# Patient Record
Sex: Male | Born: 1995 | Race: White | Hispanic: No | Marital: Married | Smoking: Former smoker
Health system: Southern US, Community
[De-identification: ages and names within clinical notes are randomized; demographics above are authoritative.]

## PROBLEM LIST (undated history)

## (undated) DIAGNOSIS — J45909 Unspecified asthma, uncomplicated: Secondary | ICD-10-CM

## (undated) DIAGNOSIS — D492 Neoplasm of unspecified behavior of bone, soft tissue, and skin: Secondary | ICD-10-CM

---
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
## 2019-06-06 DIAGNOSIS — K219 Gastro-esophageal reflux disease without esophagitis: Secondary | ICD-10-CM | POA: Insufficient documentation

## 2021-05-28 ENCOUNTER — Encounter (HOSPITAL_BASED_OUTPATIENT_CLINIC_OR_DEPARTMENT_OTHER): Payer: Self-pay | Admitting: Emergency Medicine

## 2021-05-28 ENCOUNTER — Emergency Department (HOSPITAL_BASED_OUTPATIENT_CLINIC_OR_DEPARTMENT_OTHER)
Admission: EM | Admit: 2021-05-28 | Discharge: 2021-05-28 | Disposition: A | Discharge status: Home / Self Care | Payer: Self-pay | Attending: Emergency Medicine | Admitting: Emergency Medicine

## 2021-05-28 ENCOUNTER — Other Ambulatory Visit: Payer: Self-pay

## 2021-05-28 ENCOUNTER — Emergency Department (HOSPITAL_BASED_OUTPATIENT_CLINIC_OR_DEPARTMENT_OTHER): Payer: Self-pay

## 2021-05-28 DIAGNOSIS — Z85830 Personal history of malignant neoplasm of bone: Secondary | ICD-10-CM | POA: Insufficient documentation

## 2021-05-28 DIAGNOSIS — H5461 Unqualified visual loss, right eye, normal vision left eye: Secondary | ICD-10-CM

## 2021-05-28 DIAGNOSIS — Z87891 Personal history of nicotine dependence: Secondary | ICD-10-CM | POA: Insufficient documentation

## 2021-05-28 DIAGNOSIS — S0591XA Unspecified injury of right eye and orbit, initial encounter: Secondary | ICD-10-CM | POA: Insufficient documentation

## 2021-05-28 DIAGNOSIS — W228XXA Striking against or struck by other objects, initial encounter: Secondary | ICD-10-CM | POA: Insufficient documentation

## 2021-05-28 DIAGNOSIS — Y990 Civilian activity done for income or pay: Secondary | ICD-10-CM | POA: Insufficient documentation

## 2021-05-28 MED ORDER — FLUORESCEIN SODIUM 1 MG OP STRP
1.0000 | ORAL_STRIP | Freq: Once | OPHTHALMIC | Status: AC
  Administered 2021-05-28: 1 via OPHTHALMIC
  Filled 2021-05-28: qty 1

## 2021-05-28 MED ORDER — CYCLOPENTOLATE HCL 1 % OP SOLN
1.0000 [drp] | Freq: Once | OPHTHALMIC | Status: AC
  Administered 2021-05-28: 1 [drp] via OPHTHALMIC
  Filled 2021-05-28: qty 2

## 2021-05-28 MED ORDER — TETRACAINE HCL 0.5 % OP SOLN
2.0000 [drp] | Freq: Once | OPHTHALMIC | Status: AC
  Administered 2021-05-28: 2 [drp] via OPHTHALMIC
  Filled 2021-05-28: qty 4
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
## 2021-05-28 NOTE — Discharge Instructions (Addendum)
Please call Dr. Zenia Resides office first thing in the morning for recheck.

## 2021-08-26 ENCOUNTER — Emergency Department (HOSPITAL_BASED_OUTPATIENT_CLINIC_OR_DEPARTMENT_OTHER): Payer: Self-pay | Admitting: Radiology

## 2021-08-26 ENCOUNTER — Other Ambulatory Visit: Payer: Self-pay

## 2021-08-26 ENCOUNTER — Encounter (HOSPITAL_BASED_OUTPATIENT_CLINIC_OR_DEPARTMENT_OTHER): Payer: Self-pay

## 2021-08-26 ENCOUNTER — Emergency Department (HOSPITAL_BASED_OUTPATIENT_CLINIC_OR_DEPARTMENT_OTHER)
Admission: EM | Admit: 2021-08-26 | Discharge: 2021-08-26 | Disposition: A | Discharge status: Home / Self Care | Payer: Self-pay | Attending: Emergency Medicine | Admitting: Emergency Medicine

## 2021-08-26 DIAGNOSIS — R197 Diarrhea, unspecified: Secondary | ICD-10-CM | POA: Insufficient documentation

## 2021-08-26 DIAGNOSIS — R042 Hemoptysis: Secondary | ICD-10-CM | POA: Insufficient documentation

## 2021-08-26 DIAGNOSIS — Z87891 Personal history of nicotine dependence: Secondary | ICD-10-CM | POA: Insufficient documentation

## 2021-08-26 LAB — COMPREHENSIVE METABOLIC PANEL
ALT: 20 U/L (ref 0–44)
AST: 20 U/L (ref 15–41)
Albumin: 4.8 g/dL (ref 3.5–5.0)
Alkaline Phosphatase: 80 U/L (ref 38–126)
Anion gap: 8 (ref 5–15)
BUN: 11 mg/dL (ref 6–20)
CO2: 29 mmol/L (ref 22–32)
Calcium: 10.1 mg/dL (ref 8.9–10.3)
Chloride: 106 mmol/L (ref 98–111)
Creatinine, Ser: 1.26 mg/dL — ABNORMAL HIGH (ref 0.61–1.24)
GFR, Estimated: >60 mL/min (ref >60)
Glucose, Bld: 97 mg/dL (ref 70–99)
Potassium: 3.9 mmol/L (ref 3.5–5.1)
Sodium: 143 mmol/L (ref 135–145)
Total Bilirubin: 0.4 mg/dL (ref 0.3–1.2)
Total Protein: 8.1 g/dL (ref 6.5–8.1)

## 2021-08-26 LAB — CBC WITH DIFFERENTIAL/PLATELET
Abs Immature Granulocytes: 0.01 10*3/uL (ref 0.00–0.07)
Basophils Absolute: 0 10*3/uL (ref 0.0–0.1)
Basophils Relative: 0 %
Eosinophils Absolute: 0.1 10*3/uL (ref 0.0–0.5)
Eosinophils Relative: 1 %
HCT: 45.2 % (ref 39.0–52.0)
Hemoglobin: 15.2 g/dL (ref 13.0–17.0)
Immature Granulocytes: 0 %
Lymphocytes Relative: 20 %
Lymphs Abs: 1.1 10*3/uL (ref 0.7–4.0)
MCH: 29.7 pg (ref 26.0–34.0)
MCHC: 33.6 g/dL (ref 30.0–36.0)
MCV: 88.3 fL (ref 80.0–100.0)
Monocytes Absolute: 0.5 10*3/uL (ref 0.1–1.0)
Monocytes Relative: 9 %
Neutro Abs: 3.9 10*3/uL (ref 1.7–7.7)
Neutrophils Relative %: 70 %
Platelets: 299 10*3/uL (ref 150–400)
RBC: 5.12 MIL/uL (ref 4.22–5.81)
RDW: 12.4 % (ref 11.5–15.5)
WBC: 5.6 10*3/uL (ref 4.0–10.5)
nRBC: 0 % (ref 0.0–0.2)

## 2021-08-26 NOTE — ED Triage Notes (Signed)
He tells me that he had, and completely recovered from mild u.r.I. ~ 2 weeks ago. He is here today with c/o upper abd. Discomfort; 3 diarrhea stools today and upon him coughing at work this morning, his boss noticed that he had coughed up "some blood". He is ambulatory and in no distress.

## 2021-08-26 NOTE — ED Provider Notes (Signed)
[facility] Provider Note  CSN: [id number] Arrival date & time: 08/26/21 6378    History Chief Complaint  Patient presents with   Hemoptysis    Colin Morales is a 25 y.o. male with history of 'stomach problems' (thinks he has IBS but no formal diagnosis) reports he has had diarrhea for the last two days, more than usual for him, but no abdominal pain, fever or blood. No vomiting. He was at work today when he coughed up some bloody sputum x 1 and his boss advised him to come to the ED. He had a URI about 2 weeks ago that he had recovered from. No CP or SOB.    Past Medical History:  Diagnosis Date   Bone tumor     No past surgical history on file.  No family history on file.  Social History   Tobacco Use   Smoking status: Former    Types: Cigarettes    Quit date: 05/28/2020    Years since quitting: 1.2    Passive exposure: Yes   Smokeless tobacco: Former    Quit date: 05/28/2020  Vaping Use   Vaping Use: Never used     Home Medications Prior to Admission medications   Medication Sig Start Date End Date Taking? Authorizing Provider  escitalopram (LEXAPRO) 5 MG tablet Take 5 mg by mouth daily.    [provider]  ibuprofen (ADVIL,MOTRIN) 200 MG tablet Take 600 mg by mouth once.    [provider]  lactobacillus acidophilus & bulgar (LACTINEX) chewable tablet Chew 1 tablet by mouth 3 (three) times daily with meals. For 5 days 05/11/15   Harlene Salts, MD  Naproxen Sodium (ALEVE) 220 MG CAPS Take 220 mg by mouth daily.    [provider]     Allergies    Patient has no known allergies.   Review of Systems   Review of Systems A comprehensive review of systems was completed and negative except as noted in HPI.    Physical Exam BP 134/72 (BP Location: Right Arm)   Pulse (!) 59   Temp 98.1 F (36.7 C) (Oral)   Resp 16   SpO2 98%   Physical Exam Vitals and nursing note reviewed.  Constitutional:       Appearance: Normal appearance.  HENT:     Head: Normocephalic and atraumatic.     Nose: Nose normal.     Mouth/Throat:     Mouth: Mucous membranes are moist.  Eyes:     Extraocular Movements: Extraocular movements intact.     Conjunctiva/sclera: Conjunctivae normal.  Cardiovascular:     Rate and Rhythm: Normal rate.  Pulmonary:     Effort: Pulmonary effort is normal.     Breath sounds: Normal breath sounds.  Abdominal:     General: Abdomen is flat.     Palpations: Abdomen is soft.     Tenderness: There is no abdominal tenderness.  Musculoskeletal:        General: No swelling. Normal range of motion.     Cervical back: Neck supple.  Skin:    General: Skin is warm and dry.  Neurological:     General: No focal deficit present.     Mental Status: He is alert.  Psychiatric:        Mood and Affect: Mood normal.     ED Results / Procedures / Treatments   Labs (all labs ordered are listed, but only abnormal results are displayed) Labs Reviewed - No  data to display  EKG None   Radiology No results found.  Procedures Procedures  Medications Ordered in the ED Medications - No data to display   MDM Rules/Calculators/A&P MDM Patient well appearing with one episode of low volume hemoptysis, recent had URI but had recovered. Also having multiple episodes of diarrhea, 10+ in the last 2 days, will check labs and CXR.   ED Course  I have reviewed the triage vital signs and the nursing notes.  Pertinent labs & imaging results that were available during my care of the patient were reviewed by me and considered in my medical decision making (see chart for details).  Clinical Course as of 08/26/21 1143  Wed Aug 26, 2021  1034 CXR is clear.  [CS]  7948 CBC is normal.  [CS]  0165 CMP is about at baseline.  [CS]  1140 No further coughing or episodes of hemoptysis while in the ED. Could be due to recent URI symptoms. But appears well, no hypoxia or other signs of distress.  Recommend GI follow up for his 'stomach issues' and diarrhea if they continue. RTED for any other concerns.  [CS]    Clinical Course User Index [CS] Truddie Hidden, MD    Final Clinical Impression(s) / ED Diagnoses Final diagnoses:  Hemoptysis  Diarrhea, unspecified type    Rx / DC Orders ED Discharge Orders     None        Truddie Hidden, MD 08/26/21 1143

## 2022-02-18 ENCOUNTER — Emergency Department (HOSPITAL_BASED_OUTPATIENT_CLINIC_OR_DEPARTMENT_OTHER)
Admission: EM | Admit: 2022-02-18 | Discharge: 2022-02-19 | Disposition: A | Discharge status: Home / Self Care | Payer: 59 | Attending: Emergency Medicine | Admitting: Emergency Medicine

## 2022-02-18 ENCOUNTER — Emergency Department (HOSPITAL_BASED_OUTPATIENT_CLINIC_OR_DEPARTMENT_OTHER): Payer: 59

## 2022-02-18 ENCOUNTER — Encounter (HOSPITAL_BASED_OUTPATIENT_CLINIC_OR_DEPARTMENT_OTHER): Payer: Self-pay

## 2022-02-18 ENCOUNTER — Other Ambulatory Visit: Payer: Self-pay

## 2022-02-18 DIAGNOSIS — R1032 Left lower quadrant pain: Secondary | ICD-10-CM | POA: Insufficient documentation

## 2022-02-18 DIAGNOSIS — R1031 Right lower quadrant pain: Secondary | ICD-10-CM | POA: Diagnosis not present

## 2022-02-18 DIAGNOSIS — R1084 Generalized abdominal pain: Secondary | ICD-10-CM

## 2022-02-18 HISTORY — DX: Unspecified asthma, uncomplicated: J45.909

## 2022-02-18 LAB — URINALYSIS, ROUTINE W REFLEX MICROSCOPIC
Bilirubin Urine: NEGATIVE
Glucose, UA: NEGATIVE mg/dL
Hgb urine dipstick: NEGATIVE
Ketones, ur: NEGATIVE mg/dL
Leukocytes,Ua: NEGATIVE
Nitrite: NEGATIVE
Protein, ur: NEGATIVE mg/dL
Specific Gravity, Urine: 1.006 (ref 1.005–1.030)
pH: 7.5 (ref 5.0–8.0)

## 2022-02-18 LAB — COMPREHENSIVE METABOLIC PANEL
ALT: 18 U/L (ref 0–44)
AST: 19 U/L (ref 15–41)
Albumin: 4.9 g/dL (ref 3.5–5.0)
Alkaline Phosphatase: 78 U/L (ref 38–126)
Anion gap: 8 (ref 5–15)
BUN: 10 mg/dL (ref 6–20)
CO2: 28 mmol/L (ref 22–32)
Calcium: 9.9 mg/dL (ref 8.9–10.3)
Chloride: 106 mmol/L (ref 98–111)
Creatinine, Ser: 1.13 mg/dL (ref 0.61–1.24)
GFR, Estimated: >60 mL/min (ref >60)
Glucose, Bld: 75 mg/dL (ref 70–99)
Potassium: 3.9 mmol/L (ref 3.5–5.1)
Sodium: 142 mmol/L (ref 135–145)
Total Bilirubin: 0.4 mg/dL (ref 0.3–1.2)
Total Protein: 7.8 g/dL (ref 6.5–8.1)

## 2022-02-18 LAB — LIPASE, BLOOD: Lipase: 34 U/L (ref 11–51)

## 2022-02-18 LAB — CBC
HCT: 43.1 % (ref 39.0–52.0)
Hemoglobin: 14.3 g/dL (ref 13.0–17.0)
MCH: 29.1 pg (ref 26.0–34.0)
MCHC: 33.2 g/dL (ref 30.0–36.0)
MCV: 87.6 fL (ref 80.0–100.0)
Platelets: 265 10*3/uL (ref 150–400)
RBC: 4.92 MIL/uL (ref 4.22–5.81)
RDW: 12.5 % (ref 11.5–15.5)
WBC: 7.3 10*3/uL (ref 4.0–10.5)
nRBC: 0 % (ref 0.0–0.2)

## 2022-02-18 MED ORDER — IOHEXOL 300 MG/ML  SOLN
100.0000 mL | Freq: Once | INTRAMUSCULAR | Status: AC | PRN
  Administered 2022-02-18: 85 mL via INTRAVENOUS

## 2022-02-18 MED ORDER — ONDANSETRON HCL 4 MG/2ML IJ SOLN
4.0000 mg | Freq: Once | INTRAMUSCULAR | Status: AC
  Administered 2022-02-18: 4 mg via INTRAVENOUS
  Filled 2022-02-18: qty 2

## 2022-02-18 MED ORDER — SODIUM CHLORIDE 0.9 % IV BOLUS
1000.0000 mL | Freq: Once | INTRAVENOUS | Status: AC
  Administered 2022-02-18: 1000 mL via INTRAVENOUS

## 2022-02-18 MED ORDER — KETOROLAC TROMETHAMINE 30 MG/ML IJ SOLN
30.0000 mg | Freq: Once | INTRAMUSCULAR | Status: AC
  Administered 2022-02-18: 30 mg via INTRAVENOUS
  Filled 2022-02-18: qty 1

## 2022-02-18 NOTE — ED Notes (Signed)
Patient has cup for urine sample ?

## 2022-02-18 NOTE — ED Provider Notes (Signed)
?[facility] ?Provider Note ? ? ?CSN: [id number] ?Arrival date & time: 02/18/22  1847 ? ?  ? ?History ? ?Chief Complaint  ?Patient presents with  ? Abdominal Pain  ? ? ?Colin Morales is a 26 y.o. male. ? ?Patient is a 26 year old male with past medical history of abdominal pain for which she was diagnosed with an underactive gallbladder 5 or 6 years ago.  Patient presenting with worsening abdominal pain over the past 3 weeks.  He describes constant pain to the lower abdomen that waxes and wanes.  He reports having normal bowel movements and denies any diarrhea, nausea, or vomiting.  He denies any bloody stools.  He reports intermittent fevers, but denies ill contacts or having consumed any undercooked or suspicious foods. ? ?The history is provided by the patient.  ?Abdominal Pain ?Pain location:  Generalized ?Pain quality: cramping   ?Pain radiates to:  Does not radiate ?Pain severity:  Moderate ?Onset quality:  Gradual ?Duration:  3 weeks ?Timing:  Constant ?Progression:  Worsening ?Chronicity:  New ?Relieved by:  Nothing ?Worsened by:  Movement and palpation ? ?  ? ?Home Medications ?Prior to Admission medications   ?Medication Sig Start Date End Date Taking? Authorizing Provider  ?ibuprofen (ADVIL,MOTRIN) 200 MG tablet Take 600 mg by mouth once.    [provider]  ?lactobacillus acidophilus & bulgar (LACTINEX) chewable tablet Chew 1 tablet by mouth 3 (three) times daily with meals. For 5 days ?Patient not taking: No sig reported 05/11/15   Harlene Salts, MD  ?   ? ?Allergies    ?Patient has no known allergies.   ? ?Review of Systems   ?Review of Systems  ?Gastrointestinal:  Positive for abdominal pain.  ?All other systems reviewed and are negative. ? ?Physical Exam ?Updated Vital Signs ?BP 123/67   Pulse (!) 54   Temp 98.3 ?F (36.8 ?C)   Resp 17   Ht '6\' 1"'$  (1.854 m)   Wt 93 kg   SpO2 100%   BMI 27.05 kg/m?  ?Physical Exam ?Vitals and nursing note reviewed.   ?Constitutional:   ?   General: He is not in acute distress. ?   Appearance: He is well-developed. He is not diaphoretic.  ?HENT:  ?   Head: Normocephalic and atraumatic.  ?Cardiovascular:  ?   Rate and Rhythm: Normal rate and regular rhythm.  ?   Heart sounds: No murmur heard. ?  No friction rub.  ?Pulmonary:  ?   Effort: Pulmonary effort is normal. No respiratory distress.  ?   Breath sounds: Normal breath sounds. No wheezing or rales.  ?Abdominal:  ?   General: Bowel sounds are normal. There is no distension.  ?   Palpations: Abdomen is soft.  ?   Tenderness: There is abdominal tenderness in the right lower quadrant, suprapubic area and left lower quadrant. There is no right CVA tenderness, left CVA tenderness, guarding or rebound.  ?Musculoskeletal:     ?   General: Normal range of motion.  ?   Cervical back: Normal range of motion and neck supple.  ?Skin: ?   General: Skin is warm and dry.  ?Neurological:  ?   Mental Status: He is alert and oriented to person, place, and time.  ?   Coordination: Coordination normal.  ? ? ?ED Results / Procedures / Treatments   ?Labs ?(all labs ordered are listed, but only abnormal results are displayed) ?Labs Reviewed  ?URINALYSIS, ROUTINE W REFLEX MICROSCOPIC - Abnormal; Notable for  the following components:  ?    Result Value  ? Color, Urine COLORLESS (*)   ? All other components within normal limits  ?LIPASE, BLOOD  ?COMPREHENSIVE METABOLIC PANEL  ?CBC  ? ? ?EKG ?None ? ?Radiology ?No results found. ? ?Procedures ?Procedures  ? ? ?Medications Ordered in ED ?Medications  ?sodium chloride 0.9 % bolus 1,000 mL (has no administration in time range)  ?ondansetron (ZOFRAN) injection 4 mg (has no administration in time range)  ?ketorolac (TORADOL) 30 MG/ML injection 30 mg (has no administration in time range)  ? ? ?ED Course/ Medical Decision Making/ A&P ? ?Patient is a 26 year old male presenting with complaints of abdominal pain as described in the HPI.  He has a history of GI  issues, but has worsened over the past 3 weeks.  Patient's laboratory studies are reassuring and CT scan shows no acute abnormality.  He does have an increased stool burden consistent with slow transit.  I will recommend magnesium citrate and place a referral for the patient to follow-up as an outpatient with gastroenterology.  But seems appropriate for discharge this evening.  There is nothing surgical on his CAT scan. ? ?Final Clinical Impression(s) / ED Diagnoses ?Final diagnoses:  ?None  ? ? ?Rx / DC Orders ?ED Discharge Orders   ? ? None  ? ?  ? ? ?  ?Veryl Speak, MD ?02/19/22 0005 ? ?

## 2022-02-18 NOTE — ED Triage Notes (Signed)
CC of abdominal pain in right and left lower quadrant x3 weeks. Pt states it is intermittent. Pt states has n/v/d and is lightheaded. Denies fever.  ?

## 2022-02-19 NOTE — Discharge Instructions (Signed)
Take magnesium citrate.  Drink the entire 10 ounce bottle for relief of constipation.  This medication is available over-the-counter without a prescription. ? ?Follow-up with gastroenterology in the next week.  The contact information for Dr. Paulita Fujita with [facility] has been provided in this discharge summary for you to call and make these arrangements. ? ?Return to the emergency department if you develop high fever, worsening pain, bloody stool, or other new and concerning symptoms. ?

## 2023-05-08 ENCOUNTER — Encounter (HOSPITAL_BASED_OUTPATIENT_CLINIC_OR_DEPARTMENT_OTHER): Payer: Self-pay

## 2023-05-08 ENCOUNTER — Emergency Department (HOSPITAL_BASED_OUTPATIENT_CLINIC_OR_DEPARTMENT_OTHER): Payer: Self-pay

## 2023-05-08 ENCOUNTER — Inpatient Hospital Stay (HOSPITAL_BASED_OUTPATIENT_CLINIC_OR_DEPARTMENT_OTHER)
Admission: EM | Admit: 2023-05-08 | Discharge: 2023-05-10 | DRG: 603 | Disposition: A | Discharge status: Home / Self Care | Payer: Self-pay | Attending: Family Medicine | Admitting: Family Medicine

## 2023-05-08 ENCOUNTER — Other Ambulatory Visit: Payer: Self-pay

## 2023-05-08 DIAGNOSIS — L03116 Cellulitis of left lower limb: Principal | ICD-10-CM | POA: Diagnosis present

## 2023-05-08 DIAGNOSIS — R7302 Impaired glucose tolerance (oral): Secondary | ICD-10-CM | POA: Diagnosis present

## 2023-05-08 DIAGNOSIS — J45909 Unspecified asthma, uncomplicated: Secondary | ICD-10-CM | POA: Diagnosis present

## 2023-05-08 DIAGNOSIS — Z87891 Personal history of nicotine dependence: Secondary | ICD-10-CM

## 2023-05-08 LAB — COMPREHENSIVE METABOLIC PANEL
ALT: 13 U/L (ref 0–44)
AST: 14 U/L — ABNORMAL LOW (ref 15–41)
Albumin: 4.3 g/dL (ref 3.5–5.0)
Alkaline Phosphatase: 69 U/L (ref 38–126)
Anion gap: 7 (ref 5–15)
BUN: 13 mg/dL (ref 6–20)
CO2: 27 mmol/L (ref 22–32)
Calcium: 9.3 mg/dL (ref 8.9–10.3)
Chloride: 106 mmol/L (ref 98–111)
Creatinine, Ser: 0.97 mg/dL (ref 0.61–1.24)
GFR, Estimated: >60 mL/min (ref >60)
Glucose, Bld: 111 mg/dL — ABNORMAL HIGH (ref 70–99)
Potassium: 3.8 mmol/L (ref 3.5–5.1)
Sodium: 140 mmol/L (ref 135–145)
Total Bilirubin: 0.3 mg/dL (ref 0.3–1.2)
Total Protein: 7 g/dL (ref 6.5–8.1)

## 2023-05-08 LAB — CBC WITH DIFFERENTIAL/PLATELET
Abs Immature Granulocytes: 0.03 10*3/uL (ref 0.00–0.07)
Basophils Absolute: 0 10*3/uL (ref 0.0–0.1)
Basophils Relative: 0 %
Eosinophils Absolute: 0.2 10*3/uL (ref 0.0–0.5)
Eosinophils Relative: 1 %
HCT: 38.9 % — ABNORMAL LOW (ref 39.0–52.0)
Hemoglobin: 13.4 g/dL (ref 13.0–17.0)
Immature Granulocytes: 0 %
Lymphocytes Relative: 16 %
Lymphs Abs: 1.8 10*3/uL (ref 0.7–4.0)
MCH: 30.4 pg (ref 26.0–34.0)
MCHC: 34.4 g/dL (ref 30.0–36.0)
MCV: 88.2 fL (ref 80.0–100.0)
Monocytes Absolute: 0.8 10*3/uL (ref 0.1–1.0)
Monocytes Relative: 7 %
Neutro Abs: 8.7 10*3/uL — ABNORMAL HIGH (ref 1.7–7.7)
Neutrophils Relative %: 76 %
Platelets: 257 10*3/uL (ref 150–400)
RBC: 4.41 MIL/uL (ref 4.22–5.81)
RDW: 12.6 % (ref 11.5–15.5)
WBC: 11.5 10*3/uL — ABNORMAL HIGH (ref 4.0–10.5)
nRBC: 0 % (ref 0.0–0.2)

## 2023-05-08 MED ORDER — VANCOMYCIN HCL IN DEXTROSE 1-5 GM/200ML-% IV SOLN
1000.0000 mg | Freq: Once | INTRAVENOUS | Status: AC
  Administered 2023-05-09: 1000 mg via INTRAVENOUS
  Filled 2023-05-08: qty 200

## 2023-05-08 MED ORDER — VANCOMYCIN HCL IN DEXTROSE 1-5 GM/200ML-% IV SOLN
1000.0000 mg | Freq: Once | INTRAVENOUS | Status: AC
  Administered 2023-05-08: 1000 mg via INTRAVENOUS
  Filled 2023-05-08: qty 200

## 2023-05-08 MED ORDER — IOHEXOL 300 MG/ML  SOLN
100.0000 mL | Freq: Once | INTRAMUSCULAR | Status: AC | PRN
  Administered 2023-05-08: 100 mL via INTRAVENOUS

## 2023-05-08 MED ORDER — VANCOMYCIN HCL IN DEXTROSE 1-5 GM/200ML-% IV SOLN: 1000.0000 mg | Freq: Once | INTRAVENOUS | Status: DC

## 2023-05-08 MED ORDER — PIPERACILLIN-TAZOBACTAM 3.375 G IVPB 30 MIN
3.3750 g | Freq: Once | INTRAVENOUS | Status: AC
  Administered 2023-05-09: 3.375 g via INTRAVENOUS
  Filled 2023-05-08: qty 50

## 2023-05-08 NOTE — ED Provider Notes (Signed)
EMERGENCY DEPARTMENT AT [facility] Provider Note   CSN: [id number] Arrival date & time: 05/08/23  1931     History  Chief Complaint  Patient presents with   Abscess    Camarie Regen is a 27 y.o. male.  The history is provided by the patient, medical records and a significant other. No language interpreter was used.  Rash Location:  Leg Leg rash location:  L upper leg Quality: draining, painful and redness   Pain details:    Quality:  Aching   Severity:  Severe   Onset quality:  Gradual   Duration:  1 week   Timing:  Constant   Progression:  Worsening Severity:  Severe Timing:  Constant Progression:  Spreading Context: not insect bite/sting   Relieved by:  Nothing Ineffective treatments: antibiotics. Associated symptoms: fever and induration   Associated symptoms: no abdominal pain, no diarrhea, no fatigue, no headaches, no nausea, no shortness of breath, no URI, not vomiting and not wheezing        Home Medications Prior to Admission medications   Medication Sig Start Date End Date Taking? Authorizing Provider  ibuprofen (ADVIL,MOTRIN) 200 MG tablet Take 600 mg by mouth once.    [provider]  lactobacillus acidophilus & bulgar (LACTINEX) chewable tablet Chew 1 tablet by mouth 3 (three) times daily with meals. For 5 days Patient not taking: No sig reported 05/11/15   Ree Shay, MD      Allergies    Patient has no known allergies.    Review of Systems   Review of Systems  Constitutional:  Positive for chills and fever. Negative for diaphoresis and fatigue.  HENT:  Negative for congestion.   Respiratory:  Negative for cough, chest tightness, shortness of breath and wheezing.   Cardiovascular:  Negative for chest pain and palpitations.  Gastrointestinal:  Negative for abdominal pain, constipation, diarrhea, nausea and vomiting.  Genitourinary:  Negative for dysuria and flank pain.  Musculoskeletal:  Negative for back pain, neck  pain and neck stiffness.  Skin:  Positive for rash. Negative for wound.  Neurological:  Negative for light-headedness and headaches.  Psychiatric/Behavioral:  Negative for agitation and confusion.   All other systems reviewed and are negative.   Physical Exam Updated Vital Signs BP 139/82 (BP Location: Right Arm)   Pulse 74   Temp 98.7 F (37.1 C) (Oral)   Resp 20   Ht 6\' 1"  (1.854 m)   Wt 97.5 kg   SpO2 100%   BMI 28.37 kg/m  Physical Exam Vitals and nursing note reviewed.  Constitutional:      General: He is not in acute distress.    Appearance: He is well-developed. He is not ill-appearing, toxic-appearing or diaphoretic.  HENT:     Head: Normocephalic and atraumatic.     Mouth/Throat:     Mouth: Mucous membranes are moist.  Eyes:     Extraocular Movements: Extraocular movements intact.     Conjunctiva/sclera: Conjunctivae normal.     Pupils: Pupils are equal, round, and reactive to light.  Cardiovascular:     Rate and Rhythm: Normal rate and regular rhythm.     Heart sounds: No murmur heard. Pulmonary:     Effort: Pulmonary effort is normal. No respiratory distress.     Breath sounds: Normal breath sounds. No wheezing, rhonchi or rales.  Chest:     Chest wall: No tenderness.  Abdominal:     General: Abdomen is flat.     Palpations: Abdomen  is soft.     Tenderness: There is no abdominal tenderness. There is no guarding or rebound.  Musculoskeletal:        General: Tenderness present.     Cervical back: Neck supple.     Left upper leg: Tenderness present.     Right lower leg: No edema.     Left lower leg: No edema.     Comments: Patient has a large wound to left upper inner thigh with surrounding erythema, tenderness, but no crepitance.  There is some black area that could be necrosis and some purulent drainage although there is a large wound present.  Surrounding induration, warmth, and tenderness but do not appreciate large fluctuance.  No other tenderness in the  inguinal area or abdomen.  Intact sensation, strength, and pulses distally.    Skin:    General: Skin is warm and dry.     Capillary Refill: Capillary refill takes less than 2 seconds.     Findings: Erythema present.  Neurological:     Mental Status: He is alert.     Sensory: No sensory deficit.     Motor: No weakness.  Psychiatric:        Mood and Affect: Mood normal.      ED Results / Procedures / Treatments   Labs (all labs ordered are listed, but only abnormal results are displayed) Labs Reviewed  CBC WITH DIFFERENTIAL/PLATELET - Abnormal; Notable for the following components:      Result Value   WBC 11.5 (*)    HCT 38.9 (*)    Neutro Abs 8.7 (*)    All other components within normal limits  COMPREHENSIVE METABOLIC PANEL - Abnormal; Notable for the following components:   Glucose, Bld 111 (*)    AST 14 (*)    All other components within normal limits  CULTURE, BLOOD (ROUTINE X 2)  CULTURE, BLOOD (ROUTINE X 2)  LACTIC ACID, PLASMA  LACTIC ACID, PLASMA    EKG None  Radiology No results found.  Procedures Procedures    Medications Ordered in ED Medications  vancomycin (VANCOCIN) IVPB 1000 mg/200 mL premix (1,000 mg Intravenous New Bag/Given 05/08/23 2313)    And  vancomycin (VANCOCIN) IVPB 1000 mg/200 mL premix (has no administration in time range)    ED Course/ Medical Decision Making/ A&P                             Medical Decision Making Amount and/or Complexity of Data Reviewed Labs: ordered. Radiology: ordered.  Risk Prescription drug management.    Hristopher Hopman is a 27 y.o. male with a past medical history significant for asthma who presents with leg infection.  According to patient, for the last week or so he started having some discomfort in his left inner thigh and groin area.  He thought it was just chafing but it started having more pain and redness.  Patient went to eye doctor the other day and was started on Keflex which has been on  for the last 2 days.  He reports that over the last 48 hours it has rapidly worsened and has had 3 days of fevers at home.  He reports it got swollen like a baseball and then this morning it popped and has a large crater with purulence and tenderness.  Patient reports despite taking antibiotics the redness is spreading both proximally and laterally and the pain is severe.  He is having fevers and  chills still but is denying nausea or vomiting.  He denies any numbness, tingling, or weakness of the leg but it is hurting when he moves it.  No urinary symptoms or GI symptoms otherwise.  Patient is concerned that it is spreading more rapidly and is not improving despite antibiotics.  On exam, patient has an impressive wound to his left inner thigh.  See clinical photo.  There is surrounding tenderness and induration but I do not appreciate fluctuance initially or crepitance.  There is tenderness that is away from the wound itself.  Intact sensation, strength, and pulses distally.  Lungs clear and chest and abdomen nontender.  Clinically I am concerned patient is failing outpatient antibiotics for cellulitis.  Due to the appearance and how deep this wound is looking, will get CT scan to rule out deep infection.  Will get screening labs.  Will go ahead and start IV antibiotics with vancomycin and anticipate he will need admission for IV antibiotics for failing outpatient antibiotics of cellulitis.  Will give pain medicine.  Anticipate reassessment after imaging and labs.  Care transferred to oncoming team to wait for reassessment after imaging.             Final Clinical Impression(s) / ED Diagnoses Final diagnoses:  Cellulitis of left lower extremity     Clinical Impression: 1. Cellulitis of left lower extremity     Disposition: Care transferred to oncoming team to wait for results of labs and imaging and reassessment of leg infection.  Anticipate admission for further management of failing  outpatient antibiotics of cellulitis with wound and drainage.  This note was prepared with assistance of Conservation officer, historic buildings. Occasional wrong-word or sound-a-like substitutions may have occurred due to the inherent limitations of voice recognition software.     Delena Casebeer, Canary Brim, MD 05/08/23 [phone]

## 2023-05-08 NOTE — ED Triage Notes (Signed)
Patient here POV from Home.  Endorses Abscess to Left Upper Medial Thigh. Present since Thursday. Placed on Antibiotics Saturday. Ruptured most likely last pm. Seeks Evaluation for size of abscess and drainage. Drainage is Blood and Pus. 101 Fever Friday PM. None confirmed since.   NAD Noted during Triage. A&Ox4. GCS 15. Ambulatory.

## 2023-05-09 DIAGNOSIS — L03116 Cellulitis of left lower limb: Secondary | ICD-10-CM

## 2023-05-09 LAB — CBC WITH DIFFERENTIAL/PLATELET
Abs Immature Granulocytes: 0.02 10*3/uL (ref 0.00–0.07)
Basophils Absolute: 0 10*3/uL (ref 0.0–0.1)
Basophils Relative: 0 %
Eosinophils Absolute: 0.1 10*3/uL (ref 0.0–0.5)
Eosinophils Relative: 1 %
HCT: 40.7 % (ref 39.0–52.0)
Hemoglobin: 13.2 g/dL (ref 13.0–17.0)
Immature Granulocytes: 0 %
Lymphocytes Relative: 13 %
Lymphs Abs: 1.4 10*3/uL (ref 0.7–4.0)
MCH: 29.2 pg (ref 26.0–34.0)
MCHC: 32.4 g/dL (ref 30.0–36.0)
MCV: 90 fL (ref 80.0–100.0)
Monocytes Absolute: 0.5 10*3/uL (ref 0.1–1.0)
Monocytes Relative: 5 %
Neutro Abs: 8.5 10*3/uL — ABNORMAL HIGH (ref 1.7–7.7)
Neutrophils Relative %: 81 %
Platelets: 261 10*3/uL (ref 150–400)
RBC: 4.52 MIL/uL (ref 4.22–5.81)
RDW: 12.6 % (ref 11.5–15.5)
WBC: 10.5 10*3/uL (ref 4.0–10.5)
nRBC: 0 % (ref 0.0–0.2)

## 2023-05-09 LAB — BASIC METABOLIC PANEL
Anion gap: 9 (ref 5–15)
BUN: 10 mg/dL (ref 6–20)
CO2: 23 mmol/L (ref 22–32)
Calcium: 8.8 mg/dL — ABNORMAL LOW (ref 8.9–10.3)
Chloride: 108 mmol/L (ref 98–111)
Creatinine, Ser: 0.92 mg/dL (ref 0.61–1.24)
GFR, Estimated: >60 mL/min (ref >60)
Glucose, Bld: 97 mg/dL (ref 70–99)
Potassium: 3.6 mmol/L (ref 3.5–5.1)
Sodium: 140 mmol/L (ref 135–145)

## 2023-05-09 LAB — MRSA NEXT GEN BY PCR, NASAL: MRSA by PCR Next Gen: NOT DETECTED

## 2023-05-09 LAB — AEROBIC CULTURE W GRAM STAIN (SUPERFICIAL SPECIMEN)

## 2023-05-09 LAB — LACTIC ACID, PLASMA: Lactic Acid, Venous: 0.7 mmol/L (ref 0.5–1.9)

## 2023-05-09 LAB — HIV ANTIBODY (ROUTINE TESTING W REFLEX): HIV Screen 4th Generation wRfx: NONREACTIVE

## 2023-05-09 MED ORDER — DIPHENHYDRAMINE HCL 25 MG PO CAPS: 25.0000 mg | ORAL_CAPSULE | Freq: Four times a day (QID) | ORAL | Status: DC | PRN

## 2023-05-09 MED ORDER — VANCOMYCIN HCL 1250 MG/250ML IV SOLN: 1250.0000 mg | Freq: Three times a day (TID) | INTRAVENOUS | Status: DC

## 2023-05-09 MED ORDER — ONDANSETRON HCL 4 MG/2ML IJ SOLN: 4.0000 mg | Freq: Four times a day (QID) | INTRAMUSCULAR | Status: DC | PRN

## 2023-05-09 MED ORDER — ENOXAPARIN SODIUM 40 MG/0.4ML IJ SOSY
40.0000 mg | PREFILLED_SYRINGE | INTRAMUSCULAR | Status: DC
  Administered 2023-05-09 – 2023-05-10 (×2): 40 mg via SUBCUTANEOUS
  Filled 2023-05-09 (×2): qty 0.4

## 2023-05-09 MED ORDER — DIPHENHYDRAMINE HCL 25 MG PO CAPS: 25.0000 mg | ORAL_CAPSULE | Freq: Three times a day (TID) | ORAL | Status: DC

## 2023-05-09 MED ORDER — VANCOMYCIN HCL 1250 MG/250ML IV SOLN
1250.0000 mg | Freq: Three times a day (TID) | INTRAVENOUS | Status: DC
  Filled 2023-05-09: qty 250

## 2023-05-09 MED ORDER — ACETAMINOPHEN 325 MG PO TABS
650.0000 mg | ORAL_TABLET | Freq: Four times a day (QID) | ORAL | Status: DC | PRN
  Administered 2023-05-09: 650 mg via ORAL
  Filled 2023-05-09: qty 2

## 2023-05-09 MED ORDER — VANCOMYCIN HCL 2000 MG/400ML IV SOLN: 2000.0000 mg | Freq: Two times a day (BID) | INTRAVENOUS | Status: DC

## 2023-05-09 MED ORDER — DIPHENHYDRAMINE HCL 50 MG/ML IJ SOLN
25.0000 mg | Freq: Once | INTRAMUSCULAR | Status: AC
  Administered 2023-05-09: 25 mg via INTRAVENOUS
  Filled 2023-05-09: qty 1

## 2023-05-09 MED ORDER — CEFAZOLIN SODIUM-DEXTROSE 2-4 GM/100ML-% IV SOLN
2.0000 g | Freq: Three times a day (TID) | INTRAVENOUS | Status: DC
  Administered 2023-05-09: 2 g via INTRAVENOUS
  Filled 2023-05-09: qty 100

## 2023-05-09 MED ORDER — VANCOMYCIN HCL 1250 MG/250ML IV SOLN
1250.0000 mg | Freq: Three times a day (TID) | INTRAVENOUS | Status: DC
  Administered 2023-05-09: 1250 mg via INTRAVENOUS
  Filled 2023-05-09: qty 250

## 2023-05-09 MED ORDER — ONDANSETRON HCL 4 MG PO TABS: 4.0000 mg | ORAL_TABLET | Freq: Four times a day (QID) | ORAL | Status: DC | PRN

## 2023-05-09 MED ORDER — ACETAMINOPHEN 650 MG RE SUPP: 650.0000 mg | Freq: Four times a day (QID) | RECTAL | Status: DC | PRN

## 2023-05-09 MED ORDER — LINEZOLID 600 MG PO TABS
600.0000 mg | ORAL_TABLET | Freq: Two times a day (BID) | ORAL | Status: DC
  Administered 2023-05-09 – 2023-05-10 (×3): 600 mg via ORAL
  Filled 2023-05-09 (×3): qty 1

## 2023-05-09 MED ORDER — DIPHENHYDRAMINE HCL 50 MG/ML IJ SOLN
25.0000 mg | Freq: Four times a day (QID) | INTRAMUSCULAR | Status: DC | PRN
  Administered 2023-05-09: 25 mg via INTRAVENOUS
  Filled 2023-05-09: qty 1

## 2023-05-09 MED ORDER — SENNOSIDES-DOCUSATE SODIUM 8.6-50 MG PO TABS: 1.0000 | ORAL_TABLET | Freq: Every evening | ORAL | Status: DC | PRN

## 2023-05-09 NOTE — Progress Notes (Addendum)
Interim progress note not for billing  See h and p from earlier this morning. Agree w/ assessment and plan except as otherwise stated.  Admitted for draining abscess/cellulitis medial left upper thigh. No signs sepsis. Failed outpatient abx with keflex. Here started on ancef. Blood cultures obtained, results pending. I will cancel ancef and broaden to vancomycin as we need to cover for mrsa, will obtain a culture as well as no swab has yet been obtained. Nothing that currently appears to need surgical drainage. Possible d/c tomorrow if improving. HIV pending.  Addendum: red man reaction with vancomycin, after discussion with pharmacy will switch abx to zyvox

## 2023-05-09 NOTE — Progress Notes (Addendum)
Pharmacy Antibiotic Note  Colin Morales is a 27 y.o. male admitted on 05/08/2023 with large wound to left upper inner thigh with surrounding erythema, tenderness, but no crepitance. .  Pharmacy has been consulted for vancomycin dosing.  Pt received loading dose @ [facility]  ( 1 gm x 2 doses @ 2313 & 0032)  Had Redman syndrome w/ Vanc 1 gm @ [facility] -rate slowed & IV benadryl ordered Plan: Vancomycin 1250 mg IV q8h (AUC 443.2, Scr 0.92) Infuse each dose over 2 hours d/t Redman syndrome Add prn benadryl for now. Will schedule as premed if indicated Follow renal function and clinical course  Height: 6\' 1"  (185.4 cm) Weight: 97.5 kg (215 lb) IBW/kg (Calculated) : 79.9  Temp (24hrs), Avg:98.6 F (37 C), Min:97.9 F (36.6 C), Max:99 F (37.2 C)  Recent Labs  Lab 05/08/23 2227 05/08/23 2251 05/09/23 0505  WBC 11.5*  --  10.5  CREATININE 0.97  --  0.92  LATICACIDVEN  --  0.7  --     Estimated Creatinine Clearance: 152.2 mL/min (by C-G formula based on SCr of 0.92 mg/dL).    No Known Allergies  Antimicrobials this admission: 7/15 vanc >> 7/15 ancef x 1 7/15 zosyn x 1 Dose adjustments this admission:  Microbiology results: 7/15 BCx:  sent 7/15 MRSA PCR: neg  Thank you for allowing pharmacy to be a part of this patient's care.  Herby Abraham, Pharm.D Use secure chat for questions 05/09/2023 8:49 AM  Addendum: RN started  Vanc slow as directed by pharmacy at 0955. Patient was ok until about 2 minutes ago. His forehead and behind his ears are turning red and flushed. Patient denies any tongue swelling or itching.> I ordered benadryl 25 IV x 1 Plan: decrease infusion rate to 2.5 hours & scheduled benadryl 25 mg po 30 minutes before each dose  Herby Abraham, Pharm.D Use secure chat for questions 05/09/2023 11:23 AM

## 2023-05-09 NOTE — Consult Note (Addendum)
WOC Nurse Consult Note: patient states he had a boil Left upper inner thigh that ruptured; patient currently receiving IV antibiotics for cellulitis to area  Reason for Consult: L inner thigh wound  Wound type: infectious full thickness  Pressure Injury POA: NA  Measurement: 4 cm x 2 cm x 0.5 cm  Wound bed: 75% dark red/black (likely mixture of clotted blood and necrotic tissue) 25% yellow necrotic  Drainage (amount, consistency, odor) moderate purulent  Periwound: indurated, erythematous  Dressing procedure/placement/frequency: Clean with Vashe [place] [phone]) then apply Vashe moistened (not saturated) gauze to area twice daily.  Cover with dry gauze and secure with silicone foam. May lift foam to reapply Vashe. Change foam dressing q3 days and prn soiling.   POC discussed with patient and bedside nurse.  WOC team will not follow at this time.  Please re-consult if further needs arise.   Thank you,     Priscella Mann MSN, RN-BC, [employer] [phone]

## 2023-05-09 NOTE — ED Notes (Addendum)
Pt called out with concerns for an allergic reaction went into room Pt appeared to be experiencing Redman Syndrome. I discontinued Vancomycin drip.  Provider was advised and came to bed side. I was advised by Dr Nicanor Alcon to cut rate by half and give a dose of Benadryl. Vancomycin rate was changed to 100. Benadryl was ordered. [facility] had Pt loaded for transport and advised they would give Pt Benadryl on the way to [facility].

## 2023-05-09 NOTE — Progress Notes (Signed)
Patient is transferred from [facility] to 5 E at 0200. Alert and oriented x 4. LE cellulitis and abscess. No pain complained at this time. Vital signs was taken and call light was within patient's reach. Paged admission on call as well and waiting for new orders.

## 2023-05-09 NOTE — Plan of Care (Signed)

## 2023-05-09 NOTE — Progress Notes (Signed)
Pharmacy Antibiotic Note  Colin Morales is a 27 y.o. male admitted on 05/08/2023 with large wound to left upper inner thigh with surrounding erythema, tenderness, but no crepitance. .  Pharmacy has been consulted for vancomycin dosing.  Pt received loading dose in ED  Plan: Vancomycin 2gm IV q12h (AUC 497.4, Scr 0.97) Follow renal function and clinical course  Height: 6\' 1"  (185.4 cm) Weight: 97.5 kg (215 lb) IBW/kg (Calculated) : 79.9  Temp (24hrs), Avg:98.5 F (36.9 C), Min:97.9 F (36.6 C), Max:99 F (37.2 C)  Recent Labs  Lab 05/08/23 2227 05/08/23 2251  WBC 11.5*  --   CREATININE 0.97  --   LATICACIDVEN  --  0.7    Estimated Creatinine Clearance: 144.3 mL/min (by C-G formula based on SCr of 0.97 mg/dL).    No Known Allergies  Antimicrobials this admission: 7/15 vanc >> 7/15 ancef >>  Dose adjustments this admission:   Microbiology results: 7/15 BCx:   7/15 MRSA PCR:   Thank you for allowing pharmacy to be a part of this patient's care. Arley Phenix RPh 05/09/2023, 4:31 AM

## 2023-05-09 NOTE — Progress Notes (Signed)
Hospitalist Transfer Note:  Transferring facility: [facility] Requesting provider: Dr. Nicanor Alcon (EDP at [facility]) Reason for transfer: admission for further evaluation and management of LLE cellulitis in setting of failure of outpatient oral abx.    27  year old male who presented to [facility] ED complaining of progressive erythema, discomfort, swelling, increased warmth associated with the proximal aspect of the left lower extremity at the level of the thigh, admits that the symptoms have progressed over the course of the last week in spite of good compliance with outpatient oral antibiotics over that timeframe, although the specific nature of the oral antibiotics is unclear to me.  There may have been previously an abscess at the site, although there is noted to to be active drainage from the proximal aspect of the left lower extremity, with CT of this area demonstrating subcutaneous swelling consistent with cellulitis, in the absence of any drainable fluid collection and in the absence of any subcutaneous gas to suggest necrotizing fasciitis.  Vital signs appear stable, including afebrile, normotensive, without tachycardia, while noting respiratory rate 18-20, and oxygen saturation 98 to 100% on room air.  He has a mildly elevated white blood cell count of 11,500.  Blood cultures x 2 were collected in the ED prior to initiation of IV vancomycin and Zosyn.  Subsequently, I accepted this patient for transfer for inpatient admission to a med-tele bed at [facility] or [facility] (first available) for further work-up and management of the above.      Nursing staff, Please call [facility] Admits & Consults System-Wide number on Amion [phone]) as soon as patient's arrival, so appropriate admitting provider can evaluate the pt.     Newton Pigg, DO Hospitalist

## 2023-05-09 NOTE — H&P (Signed)
PCP:   [facility]   Chief Complaint:  Lower extremity cellulitis  HPI: This is a 27 year old male with no significant past medical history.  On Wednesday he noted a boil in his right groin.  On Thursday it was noticeably bigger.  By Friday it was a size of a small baseball.  Friday night and Saturday night he spiked fevers Tmax 101.  On Saturday he went to urgent care where he was prescribed antibiotics.  The infection started to drain.  Yesterday in the morning it busted and formed a crater.  As the day progressed crater started getting deeper, developing into a jagged hole.  It was red and hot.  Per patient he's had no previous skin infection.  He went to [facility].  At [facility] labs were unremarkable except for WBC of 11.5. CT of this area demonstrating subcutaneous swelling consistent with cellulitis, in the absence of any drainable fluid collection and in the absence of any subcutaneous gas to suggest necrotizing fasciitis.  But given the speed of progression of the infection and its suboptimal response antibiotics admission requested  Review of Systems:  Per HPI  Past Medical History: Past Medical History:  Diagnosis Date   Asthma    Bone tumor    History reviewed. No pertinent surgical history.  Medications: Prior to Admission medications   Medication Sig Start Date End Date Taking? Authorizing Provider  ibuprofen (ADVIL,MOTRIN) 200 MG tablet Take 600 mg by mouth once.    [provider]  lactobacillus acidophilus & bulgar (LACTINEX) chewable tablet Chew 1 tablet by mouth 3 (three) times daily with meals. For 5 days Patient not taking: No sig reported 05/11/15   Ree Shay, MD    Allergies:  No Known Allergies  Social History:  reports that he quit smoking about 2 years ago. His smoking use included cigarettes. He has been exposed to tobacco smoke. He quit smokeless tobacco use about 2 years ago. He reports current alcohol use.  He reports that he does not currently use drugs.  Family History: History reviewed. No pertinent family history.  Physical Exam: Vitals:   05/08/23 1936 05/08/23 2237 05/09/23 0035 05/09/23 0203  BP: 139/82 119/78  134/89  Pulse: 74 70  71  Resp: 20 18  20   Temp: 98.7 F (37.1 C)  99 F (37.2 C) 97.9 F (36.6 C)  TempSrc: Oral  Oral Oral  SpO2: 100% 98%  100%  Weight:      Height:        General:  Alert and oriented times three, well developed and nourished, no acute distress Eyes: PERRLA, pink conjunctiva, no scleral icterus ENT: Moist oral mucosa, neck supple, no thyromegaly Lungs: clear to ascultation, no wheeze, no crackles, no use of accessory muscles Cardiovascular: regular rate and rhythm, no regurgitation, no gallops, no murmurs. No carotid bruits, no JVD Abdomen: soft, positive BS, non-tender, non-distended, no organomegaly, not an acute abdomen GU: not examined Neuro: CN II - XII grossly intact, sensation intact Musculoskeletal: strength 5/5 all extremities, no clubbing, cyanosis or edema Skin: Upper inner left thigh, large wound, necrotic edges. Erythematous and firm.  Early tunneling Psych: appropriate patient  Labs on Admission:  Recent Labs    05/08/23 2227  NA 140  K 3.8  CL 106  CO2 27  GLUCOSE 111*  BUN 13  CREATININE 0.97  CALCIUM 9.3   Recent Labs    05/08/23 2227  AST 14*  ALT 13  ALKPHOS 69  BILITOT 0.3  PROT 7.0  ALBUMIN 4.3    Recent Labs    05/08/23 2227  WBC 11.5*  NEUTROABS 8.7*  HGB 13.4  HCT 38.9*  MCV 88.2  PLT 257    Radiological Exams on Admission: CT PELVIS W CONTRAST  Result Date: 05/08/2023 CLINICAL DATA:  Soft tissue infection suspected EXAM: CT PELVIS WITH CONTRAST TECHNIQUE: Multidetector CT imaging of the pelvis was performed using the standard protocol following the bolus administration of intravenous contrast. RADIATION DOSE REDUCTION: This exam was performed according to the departmental dose-optimization  program which includes automated exposure control, adjustment of the mA and/or kV according to patient size and/or use of iterative reconstruction technique. CONTRAST:  OMNIPAQUE IOHEXOL 300 MG/ML  SOLN COMPARISON:  CT abdomen and pelvis 02/18/2022 FINDINGS: Urinary Tract:  No abnormality visualized. Bowel:  Unremarkable visualized pelvic bowel loops. Vascular/Lymphatic: No pathologically enlarged lymph nodes. No significant vascular abnormality seen. Reproductive:  No mass or other significant abnormality Other: 1.9 x 0.8 cm ulceration along the medial superior left thigh (2/75). Associated skin thickening and subcutaneous fat stranding. No subcutaneous gas. No abscess. Musculoskeletal: No suspicious bone lesions identified. IMPRESSION: Small ulceration along the medial superior left thigh with associated skin thickening and subcutaneous fat stranding. No subcutaneous gas. No abscess. Electronically Signed   By: Minerva Fester M.D.   On: 05/08/2023 23:58    Assessment/Plan Present on Admission:  Cellulitis of left lower extremity -MRSA screen ordered.  Negative -Continue Ancef per cellulitis protocol -Wound care consult placed -Blood cultures collected   Naveen Clardy 05/09/2023, 2:16 AM

## 2023-05-10 ENCOUNTER — Encounter: Payer: Self-pay | Admitting: Family Medicine

## 2023-05-10 ENCOUNTER — Other Ambulatory Visit (HOSPITAL_COMMUNITY): Payer: Self-pay

## 2023-05-10 LAB — RAPID URINE DRUG SCREEN, HOSP PERFORMED
Amphetamines: NOT DETECTED
Barbiturates: NOT DETECTED
Benzodiazepines: NOT DETECTED
Cocaine: NOT DETECTED
Opiates: NOT DETECTED
Tetrahydrocannabinol: NOT DETECTED

## 2023-05-10 LAB — CBC WITH DIFFERENTIAL/PLATELET
Abs Immature Granulocytes: 0.02 10*3/uL (ref 0.00–0.07)
Basophils Absolute: 0 10*3/uL (ref 0.0–0.1)
Basophils Relative: 0 %
Eosinophils Absolute: 0.1 10*3/uL (ref 0.0–0.5)
Eosinophils Relative: 1 %
HCT: 41.5 % (ref 39.0–52.0)
Hemoglobin: 13.3 g/dL (ref 13.0–17.0)
Immature Granulocytes: 0 %
Lymphocytes Relative: 15 %
Lymphs Abs: 1.4 10*3/uL (ref 0.7–4.0)
MCH: 28.8 pg (ref 26.0–34.0)
MCHC: 32 g/dL (ref 30.0–36.0)
MCV: 89.8 fL (ref 80.0–100.0)
Monocytes Absolute: 0.5 10*3/uL (ref 0.1–1.0)
Monocytes Relative: 5 %
Neutro Abs: 7.2 10*3/uL (ref 1.7–7.7)
Neutrophils Relative %: 79 %
Platelets: 321 10*3/uL (ref 150–400)
RBC: 4.62 MIL/uL (ref 4.22–5.81)
RDW: 12.5 % (ref 11.5–15.5)
WBC: 9.2 10*3/uL (ref 4.0–10.5)
nRBC: 0 % (ref 0.0–0.2)

## 2023-05-10 LAB — COMPREHENSIVE METABOLIC PANEL
ALT: 16 U/L (ref 0–44)
AST: 14 U/L — ABNORMAL LOW (ref 15–41)
Albumin: 3.7 g/dL (ref 3.5–5.0)
Alkaline Phosphatase: 63 U/L (ref 38–126)
Anion gap: 9 (ref 5–15)
BUN: 10 mg/dL (ref 6–20)
CO2: 24 mmol/L (ref 22–32)
Calcium: 9.2 mg/dL (ref 8.9–10.3)
Chloride: 109 mmol/L (ref 98–111)
Creatinine, Ser: 1.01 mg/dL (ref 0.61–1.24)
GFR, Estimated: >60 mL/min (ref >60)
Glucose, Bld: 108 mg/dL — ABNORMAL HIGH (ref 70–99)
Potassium: 3.7 mmol/L (ref 3.5–5.1)
Sodium: 142 mmol/L (ref 135–145)
Total Bilirubin: 0.8 mg/dL (ref 0.3–1.2)
Total Protein: 7.2 g/dL (ref 6.5–8.1)

## 2023-05-10 MED ORDER — VASHE WOUND 0.033 % EX SOLN
1.0000 | CUTANEOUS | 0 refills | Status: DC
  Filled 2023-05-10: qty 1000, 21d supply, fill #0

## 2023-05-10 MED ORDER — LINEZOLID 600 MG PO TABS
600.0000 mg | ORAL_TABLET | Freq: Two times a day (BID) | ORAL | 0 refills | Status: AC
Start: 2023-05-10 — End: ?
  Filled 2023-05-10: qty 8, 4d supply, fill #0

## 2023-05-10 MED ORDER — SODIUM CHLORIDE 3 % IN NEBU
INHALATION_SOLUTION | RESPIRATORY_TRACT | 12 refills | Status: AC | PRN
  Filled 2023-05-10: qty 750, 30d supply, fill #0

## 2023-05-10 NOTE — Hospital Course (Signed)
Clean with Vashe [place] [phone]) then apply Vashe moistened (not saturated) gauze to area twice daily. Cover with dry gauze and secure with silicone foam. May lift foam to reapply Vashe. Change foam dressing q3 days and prn soiling.

## 2023-05-10 NOTE — Care Management (Signed)
   MATCH Medication Assistance Card  Name: Colin Morales ID (MRN): [id number]  Bin: [id number]  RX Group: [id number]  Discharge Date: 05/10/2023 Expiration Date: 05/17/2023 (must be filled within 7 days of discharge)  Dear [name]:  You have been approved to have the prescriptions written by your discharging physician filled through our [facility] (Medication Assistance Through [facility]) program. This program allows for a one-time (no refills) 34-day supply of selected medications for a low copay amount.  The copay is $3.00 per prescription. For instance, if you have one prescription, you will pay $3.00; for two prescriptions, you pay $6.00; for three prescriptions, you pay $9.00; and so on.  Only certain pharmacies are participating in this program with [facility]. You will need to select one of the pharmacies from the attached list and take your prescriptions, this letter, and your photo ID to one of the participating pharmacies.   We are excited that you are able to use the [facility] program to get your medications. These prescriptions must be filled within 7 days of hospital discharge or they will no longer be valid for the [facility] program. Should you have any problems with your prescriptions please contact your case management team member at [phone] for [facility].  Thank you, [facility] Health Care Management [facility] Transitions of Care Dept [phone]

## 2023-05-10 NOTE — TOC Transition Note (Signed)
Transition of Care [facility]) - CM/SW Discharge Note   Patient Details  Name: Colin Morales MRN: [id number] Date of Birth: 1996-06-29  Transition of Care [facility]) CM/SW Contact:  Otelia Santee, LCSW Phone Number: 05/10/2023, 11:58 AM   Clinical Narrative:    Pt has no insurance and shares that he typically pays for his medications out of pocket however, no longer has a job and is unable to afford medications. Pt is agreeable to using [facility] program and is aware that this program can only be used 1x within a 12 month period.  MATCH letter provided to pt. Pt's mother is to take to [facility] outpatient pharmacy to pick up medications.    Final next level of care: Home/Self Care Barriers to Discharge: No Barriers Identified   Patient Goals and CMS Choice CMS Medicare.gov Compare Post Acute Care list provided to:: Patient Choice offered to / list presented to : Patient  Discharge Placement                         Discharge Plan and Services Additional resources added to the After Visit Summary for                  DME Arranged: N/A DME Agency: NA                  Social Determinants of Health (SDOH) Interventions SDOH Screenings   Food Insecurity: No Food Insecurity (05/09/2023)  Housing: Low Risk  (05/09/2023)  Transportation Needs: No Transportation Needs (05/09/2023)  Utilities: Not At Risk (05/09/2023)  Tobacco Use: Medium Risk (05/08/2023)     Readmission Risk Interventions    05/10/2023   11:57 AM  Readmission Risk Prevention Plan  Post Dischage Appt Complete  Medication Screening Complete  Transportation Screening Complete

## 2023-05-10 NOTE — Plan of Care (Signed)
  Problem: Education: Goal: Knowledge of General Education information will improve Description: Including pain rating scale, medication(s)/side effects and non-pharmacologic comfort measures Outcome: Progressing   Problem: Health Behavior/Discharge Planning: Goal: Ability to manage health-related needs will improve Outcome: Progressing   Problem: Clinical Measurements: Goal: Ability to maintain clinical measurements within normal limits will improve Outcome: Progressing Goal: Diagnostic test results will improve Outcome: Progressing   Problem: Safety: Goal: Ability to remain free from injury will improve Outcome: Progressing   Problem: Clinical Measurements: Goal: Respiratory complications will improve Outcome: Completed/Met Goal: Cardiovascular complication will be avoided Outcome: Completed/Met   Problem: Activity: Goal: Risk for activity intolerance will decrease Outcome: Completed/Met   Problem: Nutrition: Goal: Adequate nutrition will be maintained Outcome: Completed/Met   Problem: Coping: Goal: Level of anxiety will decrease Outcome: Completed/Met   Problem: Elimination: Goal: Will not experience complications related to bowel motility Outcome: Completed/Met Goal: Will not experience complications related to urinary retention Outcome: Completed/Met   Problem: Pain Managment: Goal: General experience of comfort will improve Outcome: Completed/Met   Problem: Skin Integrity: Goal: Risk for impaired skin integrity will decrease Outcome: Completed/Met   Problem: Clinical Measurements: Goal: Will remain free from infection Outcome: Not Progressing   Problem: Education: Goal: Knowledge of General Education information will improve Description: Including pain rating scale, medication(s)/side effects and non-pharmacologic comfort measures Outcome: Progressing   Problem: Health Behavior/Discharge Planning: Goal: Ability to manage health-related needs will  improve Outcome: Progressing   Problem: Clinical Measurements: Goal: Ability to maintain clinical measurements within normal limits will improve Outcome: Progressing Goal: Diagnostic test results will improve Outcome: Progressing   Problem: Safety: Goal: Ability to remain free from injury will improve Outcome: Progressing

## 2023-05-10 NOTE — Discharge Summary (Addendum)
Physician Discharge Summary  Colin Morales URK:[id number] DOB: 20-Sep-1996 DOA: 05/08/2023  PCP: [facility]  Admit date: 05/08/2023 Discharge date: 05/10/2023  Time spent: 20 minutes  Recommendations for Outpatient Follow-up:  Patient follow-up for wound check at either urgent care or PCP in a week Needs regular preventive care in the outpatient setting in about a month time--suggest A1c, lipid panel, STI testing etc. 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 developed a boil on his inner upper thigh near the intertriginous area of his groin became the size "of a small baseball" fevers about 100.6 went to urgent care 7/20 given Keflex 7/21 area of burst formed a crater red warm went to [facility] WBC 11 CT subcutaneous swelling with cellulitis no draining fluid Admitted to [facility] Long placed on Ancef/vancomycin Blood cultures 7/14 were negative-aerobic cultures showed abundant WBC no organisms it was reintubated Because the patient exhibited "red man" syndrome on vancomycin patient was transitioned to linezolid I examined the wound as below on day of discharge it looked clean base was clean he had no exuding pus there was some induration Patient felt like he could manage at home and asked for work note I placed wound care instructions and he should follow-up with his PCP   Discharge Exam: Vitals:   05/09/23 1933 05/10/23 0622  BP: 120/68 128/63  Pulse: 83 76  Resp: 16 16  Temp: 98.9 F (37.2 C)  98.8 F (37.1 C)  SpO2: 99% 99%    Subj on day of d/c   Awake pleasant no distress  General Exam on discharge  EOMI NCAT no focal deficit S1-S2 no murmur Chest clear Abdomen soft Wound exam as below   Discharge Instructions    Allergies as of 05/10/2023   No Known Allergies      Medication List     TAKE these medications    ibuprofen 200 MG tablet Commonly known as: ADVIL Take 600 mg by mouth once.   lactobacillus acidophilus & bulgar chewable tablet Chew 1 tablet by mouth 3 (three) times daily with meals. For 5 days   linezolid 600 MG tablet Commonly known as: ZYVOX Take 1 tablet (600 mg total) by mouth every 12 (twelve) hours.               Discharge Care Instructions  (From admission, onward)           Start     Ordered   05/10/23 0000  Discharge wound care:       Comments: Clean with Normal saline gauze to area twice daily. Cover with dry gauze and secure with silicone foam. Change foam dressing q3 days and prn soiling.   05/10/23 1210           No Known Allergies    The results of significant diagnostics from this hospitalization (including imaging, microbiology, ancillary and laboratory) are listed below for reference.    Significant Diagnostic Studies: CT PELVIS W CONTRAST  Result Date: 05/08/2023 CLINICAL DATA:  Soft tissue infection suspected EXAM: CT PELVIS WITH CONTRAST TECHNIQUE: Multidetector CT  imaging of the pelvis was performed using the standard protocol following the bolus administration of intravenous contrast. RADIATION DOSE REDUCTION: This exam was performed according to the departmental dose-optimization program which includes automated exposure control, adjustment of the mA and/or kV according to patient size and/or use of iterative reconstruction technique. CONTRAST:  OMNIPAQUE IOHEXOL 300 MG/ML  SOLN COMPARISON:  CT abdomen and pelvis 02/18/2022 FINDINGS: Urinary Tract:  No abnormality visualized. Bowel:   Unremarkable visualized pelvic bowel loops. Vascular/Lymphatic: No pathologically enlarged lymph nodes. No significant vascular abnormality seen. Reproductive:  No mass or other significant abnormality Other: 1.9 x 0.8 cm ulceration along the medial superior left thigh (2/75). Associated skin thickening and subcutaneous fat stranding. No subcutaneous gas. No abscess. Musculoskeletal: No suspicious bone lesions identified. IMPRESSION: Small ulceration along the medial superior left thigh with associated skin thickening and subcutaneous fat stranding. No subcutaneous gas. No abscess. Electronically Signed   By: Minerva Fester M.D.   On: 05/08/2023 23:58    Microbiology: Recent Results (from the past 240 hour(s))  Blood culture (routine x 2)     Status: None (Preliminary result)   Collection Time: 05/08/23 10:51 PM   Specimen: BLOOD  Result Value Ref Range Status   Specimen Description   Final    BLOOD RIGHT ANTECUBITAL Performed at [facility], [address], [place], [place] [address]    Special Requests   Final    BOTTLES DRAWN AEROBIC AND ANAEROBIC Blood Culture adequate volume Performed at [facility], [address] [place], [place] [address]    Culture   Final    NO GROWTH < 24 HOURS Performed at [facility], [address], [place], [place] [address]    Report Status PENDING  Incomplete  Blood culture (routine x 2)     Status: None (Preliminary result)   Collection Time: 05/08/23 11:08 PM   Specimen: BLOOD  Result Value Ref Range Status   Specimen Description   Final    BLOOD BLOOD LEFT ARM Performed at [facility], [address], [place], [place] [address]    Special Requests   Final    BOTTLES DRAWN AEROBIC AND ANAEROBIC Blood Culture adequate volume Performed at [facility], [address] [place], [place] [address]    Culture   Final    NO GROWTH < 24 HOURS Performed at [facility], [address] [place], [place] [address]    Report Status PENDING  Incomplete  MRSA Next Gen by PCR, Nasal     Status: None   Collection Time: 05/09/23  4:36 AM   Specimen: Nasal Mucosa; Nasal Swab  Result Value Ref Range Status   MRSA by PCR Next Gen NOT DETECTED NOT DETECTED Final    Comment: (NOTE) The GeneXpert MRSA Assay (FDA approved for NASAL specimens only), is one component of a comprehensive MRSA colonization surveillance program. It is not intended to diagnose MRSA infection nor to guide or monitor treatment for MRSA infections. Test performance is not FDA approved in patients less than 16 years old. Performed at [facility], [address] [place], [place] [address]   Aerobic Culture w Gram Stain (superficial specimen)     Status: None (Preliminary result)   Collection Time: 05/09/23  9:15 AM   Specimen: Abscess  Result Value Ref Range Status   Specimen Description   Final    ABSCESS Performed at [facility], [address] [place],  [place] [address]    Special Requests   Final    NONE Performed at [facility], [address] [place], [place] [address]    Gram Stain   Final    ABUNDANT WBC PRESENT, PREDOMINANTLY PMN NO ORGANISMS SEEN    Culture   Final    CULTURE REINCUBATED FOR BETTER GROWTH Performed at [facility], [address], [place], [place] [address]    Report Status PENDING  Incomplete     Labs: Basic Metabolic Panel: Recent Labs  Lab 05/08/23 2227 05/09/23 0505 05/10/23 0835  NA 140 140 142  K 3.8 3.6 3.7  CL 106 108 109  CO2 27 23 24   GLUCOSE 111* 97 108*  BUN 13 10 10   CREATININE 0.97 0.92 1.01  CALCIUM 9.3 8.8* 9.2   Liver Function Tests: Recent Labs  Lab 05/08/23 2227 05/10/23 0835  AST 14* 14*  ALT 13 16  ALKPHOS 69 63  BILITOT 0.3 0.8  PROT 7.0 7.2  ALBUMIN 4.3 3.7   No results for input(s): "LIPASE", "AMYLASE" in the last 168  hours. No results for input(s): "AMMONIA" in the last 168 hours. CBC: Recent Labs  Lab 05/08/23 2227 05/09/23 0505 05/10/23 0835  WBC 11.5* 10.5 9.2  NEUTROABS 8.7* 8.5* 7.2  HGB 13.4 13.2 13.3  HCT 38.9* 40.7 41.5  MCV 88.2 90.0 89.8  PLT 257 261 321   Cardiac Enzymes: No results for input(s): "CKTOTAL", "CKMB", "CKMBINDEX", "TROPONINI" in the last 168 hours. BNP: BNP (last 3 results) No results for input(s): "BNP" in the last 8760 hours.  ProBNP (last 3 results) No results for input(s): "PROBNP" in the last 8760 hours.  CBG: No results for input(s): "GLUCAP" in the last 168 hours.     Signed:  Rhetta Mura MD   [facility] 05/10/2023, 11:31 AM

## 2023-05-10 NOTE — Progress Notes (Signed)
Patient, family and girlfriend were provided with discharge education, patient verbalized understanding. IV's removed.

## 2023-05-11 LAB — AEROBIC CULTURE W GRAM STAIN (SUPERFICIAL SPECIMEN)

## 2023-05-13 LAB — CULTURE, BLOOD (ROUTINE X 2)
Culture: NO GROWTH
Culture: NO GROWTH
Special Requests: ADEQUATE

## 2023-05-14 LAB — CULTURE, BLOOD (ROUTINE X 2): Special Requests: ADEQUATE

## 2024-02-24 IMAGING — CT CT ABD-PELV W/ CM
2 of 4 series · 16 of 46 positions shown, 18 images · IV contrast (APPLIED)
Comparison: CT 06/14/2017

CLINICAL DATA: Acute nonlocalized abdominal pain. Patient reports
right and left lower quadrant pain for 3 weeks, intermittent.

EXAM:
CT ABDOMEN AND PELVIS WITH CONTRAST
TECHNIQUE: Multidetector CT imaging of the abdomen and pelvis was performed
using the standard protocol following bolus administration of
intravenous contrast.

[Series 2: abd pel w · axial · 0.73mm/px · z∈[-827,-367]mm · 13 of 101 slices shown, 15 images]
[im 5/101  soft-tissue]
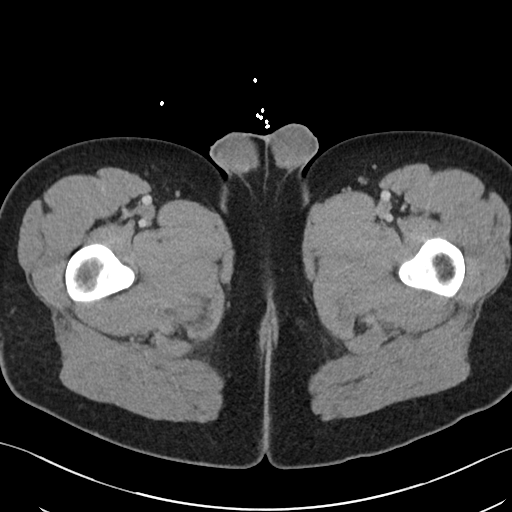
[im 5/101  bone]
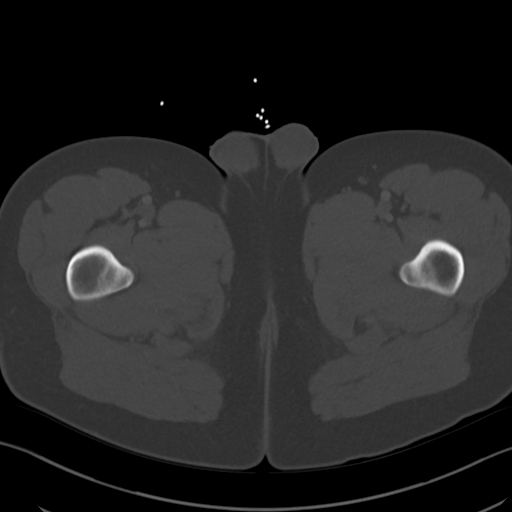
[im 13/101  soft-tissue]
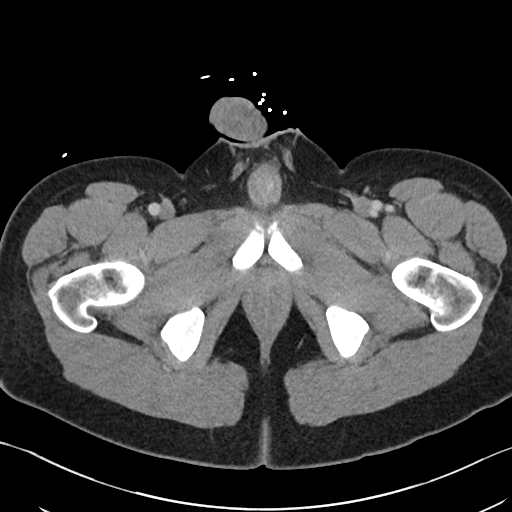
[im 21/101  soft-tissue]
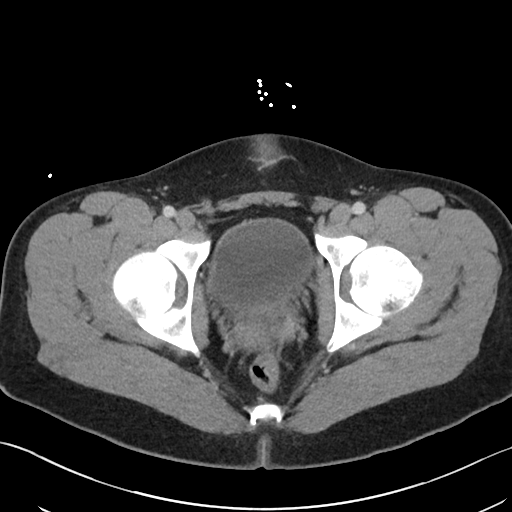
[im 29/101  soft-tissue]
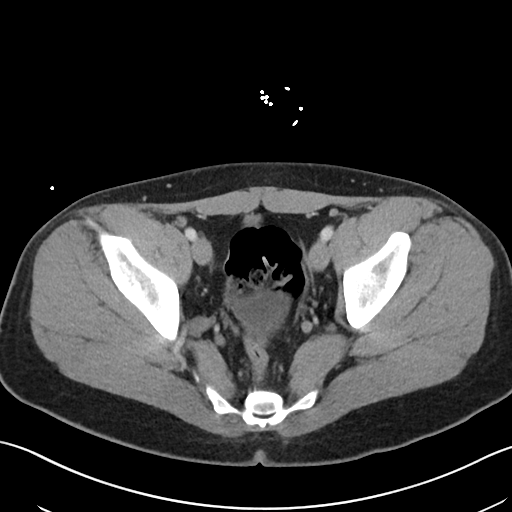
[im 37/101  soft-tissue]
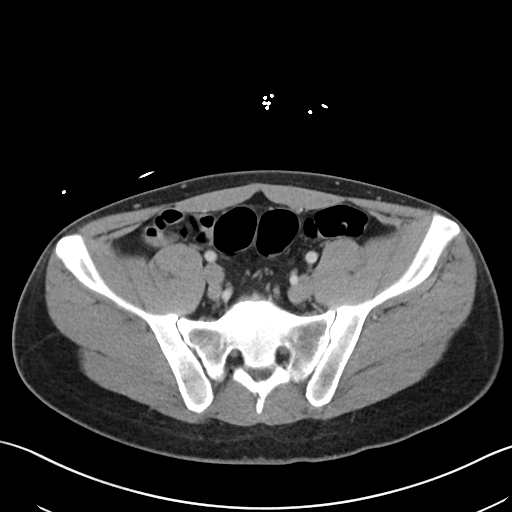
[im 45/101  soft-tissue]
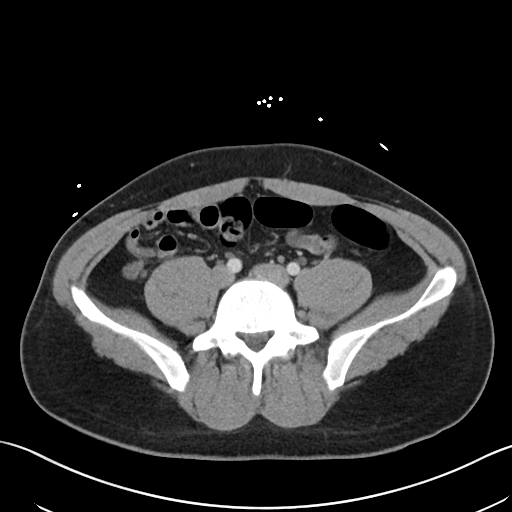
[im 53/101  soft-tissue]
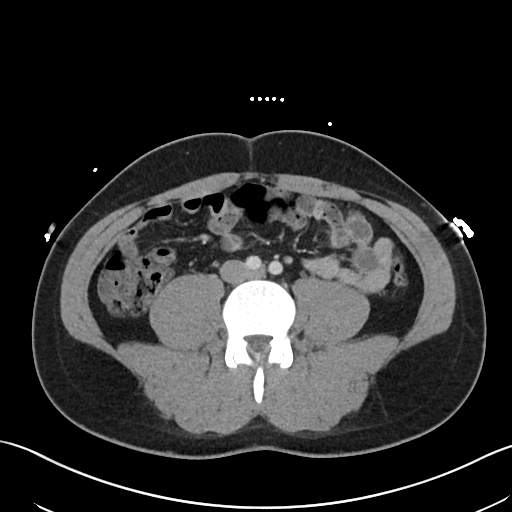
[im 57/101  soft-tissue]
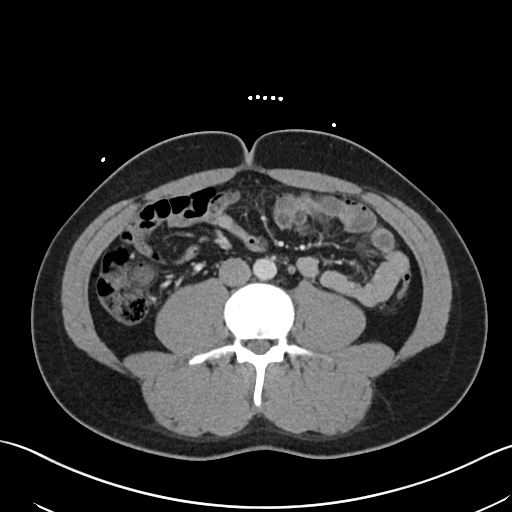
[im 65/101  soft-tissue]
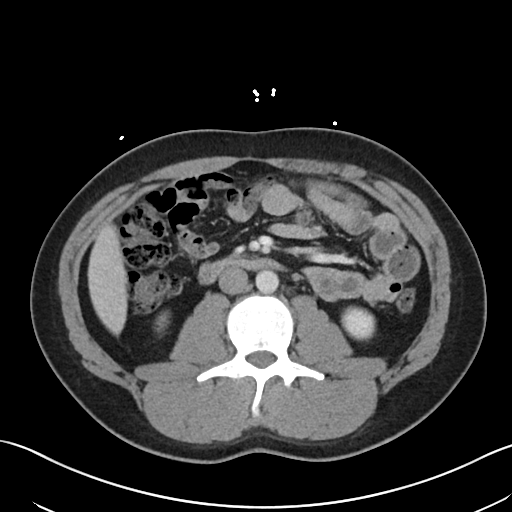
[im 65/101  bone]
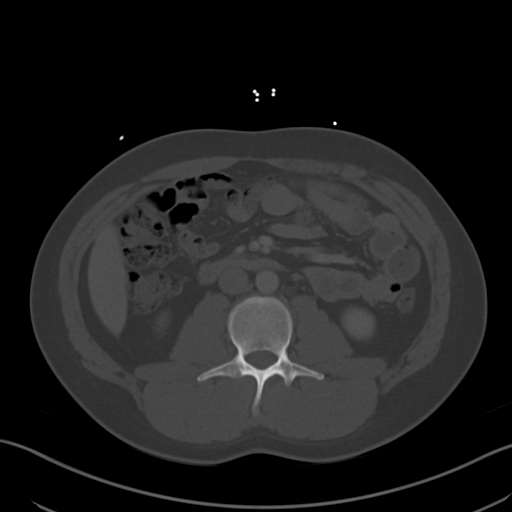
[im 73/101  soft-tissue]
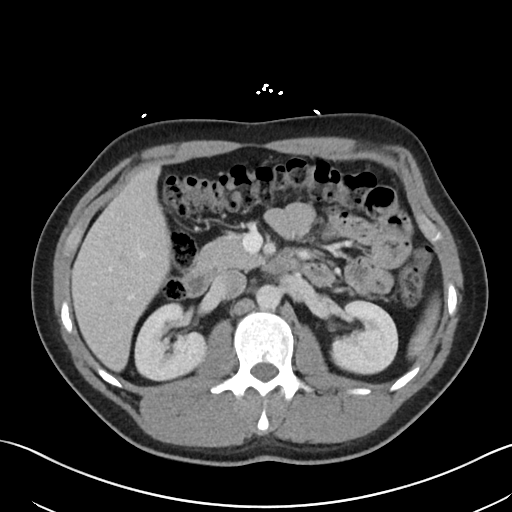
[im 81/101  soft-tissue]
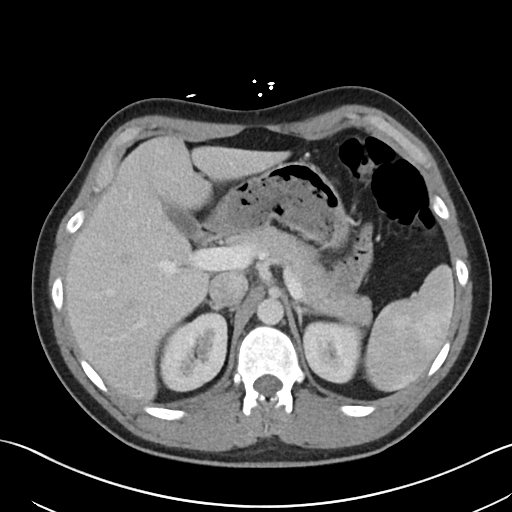
[im 89/101  soft-tissue]
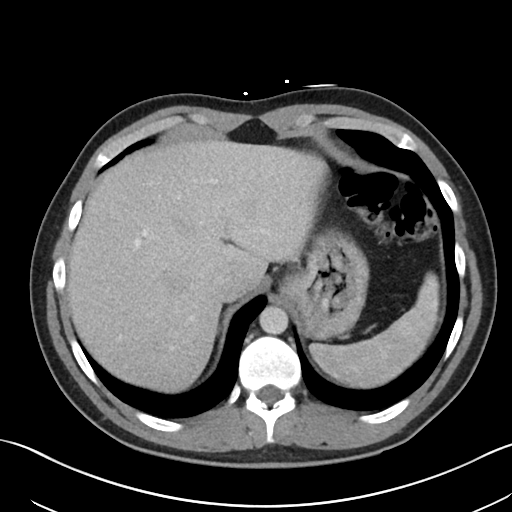
[im 97/101  soft-tissue]
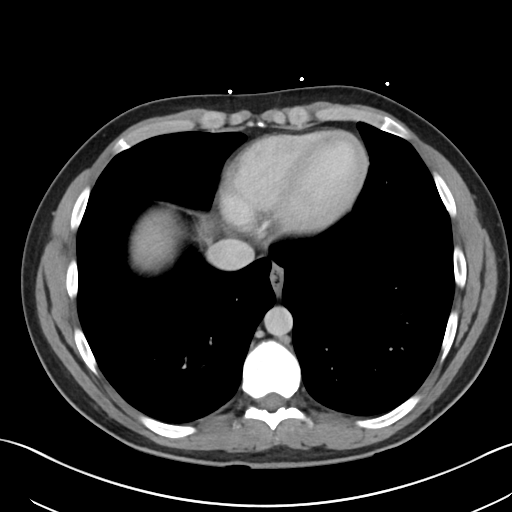

[Series 5: coronal · coronal · 0.81mm/px · 3 of 96 slices shown]
[im 32/96  soft-tissue]
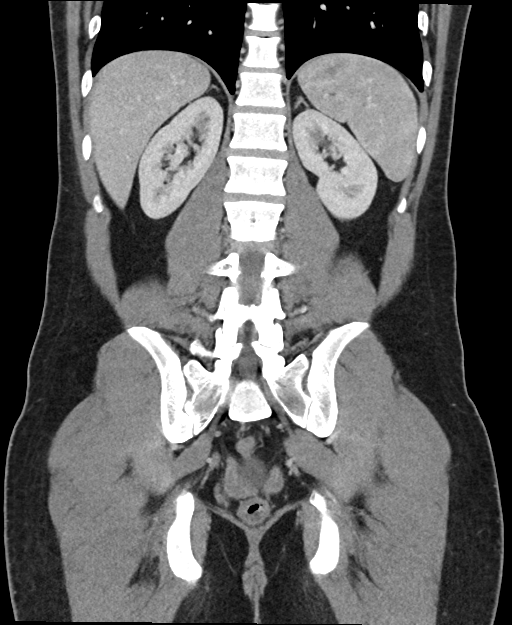
[im 43/96  soft-tissue]
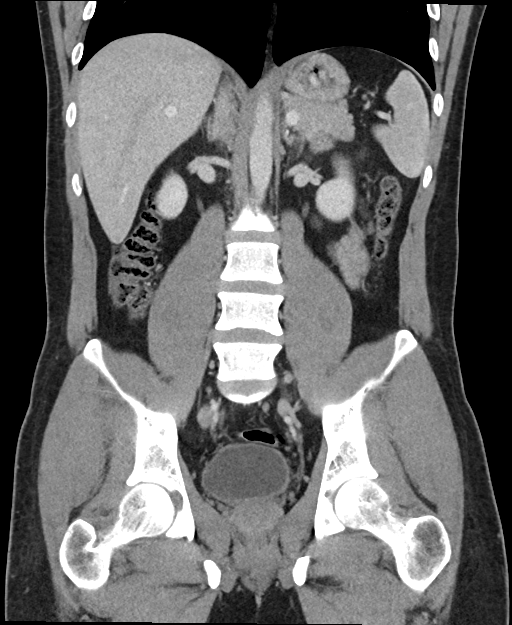
[im 53/96  soft-tissue]
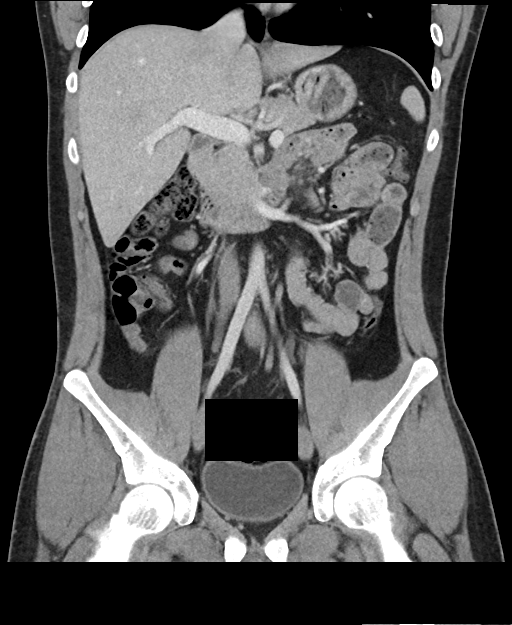

[16 of 46 positions shown; findings below may reference images not displayed]

RADIATION DOSE REDUCTION: This exam was performed according to the
departmental dose-optimization program which includes automated
exposure control, adjustment of the mA and/or kV according to
patient size and/or use of iterative reconstruction technique.

CONTRAST:  85mL OMNIPAQUE IOHEXOL 300 MG/ML  SOLN
FINDINGS: Lower chest: Clear lung bases. No acute airspace disease or pleural
effusion.

Hepatobiliary: No focal liver abnormality is seen. No gallstones,
gallbladder wall thickening, or biliary dilatation.

Pancreas: Unremarkable. No pancreatic ductal dilatation or
surrounding inflammatory changes.

Spleen: Normal in size without focal abnormality. Tiny splenule
superiorly.

Adrenals/Urinary Tract: Normal adrenal glands. No hydronephrosis or
perinephric edema. Homogeneous renal enhancement. No renal calculi
or focal renal lesion. Urinary bladder is physiologically distended
without wall thickening.

Stomach/Bowel: Ingested material within the stomach. No small bowel
obstruction or inflammation. Fecalization of distal small bowel
contents. Diminutive normal appendix. No terminal ileal
inflammation. Moderate volume of colonic stool. There is no colonic
inflammation or pericolonic edema.

Vascular/Lymphatic: Normal caliber abdominal aorta. Patent portal
vein. No acute vascular findings no enlarged lymph nodes in the
abdomen or pelvis.

Reproductive: Prostate is unremarkable.

Other: No free air, free fluid, or intra-abdominal fluid collection.
No abdominal wall hernia.

Musculoskeletal: There are no acute or suspicious osseous
abnormalities. Muscular findings to explain pain. No abdominal wall
soft tissue abnormalities.
IMPRESSION: 1. No acute abnormality or explanation for abdominal pain.
2. Moderate colonic stool burden with fecalization of distal small
bowel contents, suggesting slow transit.
